# Patient Record
Sex: Male | Born: 1995
Health system: Western US, Academic
[De-identification: ages and names within clinical notes are randomized; demographics above are authoritative.]

---

## 2017-07-14 ENCOUNTER — Encounter: Payer: Self-pay | Admitting: Emergency Medicine

## 2017-07-14 ENCOUNTER — Emergency Department: Payer: 59

## 2017-07-14 ENCOUNTER — Emergency Department
Admission: EM | Admit: 2017-07-14 | Discharge: 2017-07-14 | Disposition: A | Discharge status: Home / Self Care | Payer: 59 | Attending: Emergency Medicine | Admitting: Emergency Medicine

## 2017-07-14 DIAGNOSIS — R51 Headache: Secondary | ICD-10-CM | POA: Diagnosis not present

## 2017-07-14 DIAGNOSIS — R569 Unspecified convulsions: Secondary | ICD-10-CM | POA: Insufficient documentation

## 2017-07-14 LAB — COMPREHENSIVE METABOLIC PANEL
ALT: 19 U/L (ref 17–63)
ANION GAP: 11 (ref 5–15)
AST: 28 U/L (ref 15–41)
Albumin: 4.8 g/dL (ref 3.5–5.0)
Alkaline Phosphatase: 59 U/L (ref 38–126)
BILIRUBIN TOTAL: 1.7 mg/dL — AB (ref 0.3–1.2)
BUN: 15 mg/dL (ref 6–20)
CO2: 20 mmol/L — ABNORMAL LOW (ref 22–32)
Calcium: 9.8 mg/dL (ref 8.9–10.3)
Chloride: 109 mmol/L (ref 101–111)
Creatinine, Ser: 1.3 mg/dL — ABNORMAL HIGH (ref 0.61–1.24)
Glucose, Bld: 172 mg/dL — ABNORMAL HIGH (ref 65–99)
POTASSIUM: 3.7 mmol/L (ref 3.5–5.1)
Sodium: 140 mmol/L (ref 135–145)
TOTAL PROTEIN: 7.4 g/dL (ref 6.5–8.1)

## 2017-07-14 LAB — CBC
HEMATOCRIT: 44.6 % (ref 40.0–52.0)
Hemoglobin: 15.7 g/dL (ref 13.0–18.0)
MCH: 32.1 pg (ref 26.0–34.0)
MCHC: 35.2 g/dL (ref 32.0–36.0)
MCV: 91.2 fL (ref 80.0–100.0)
Platelets: 265 10*3/uL (ref 150–440)
RBC: 4.89 MIL/uL (ref 4.40–5.90)
RDW: 12.9 % (ref 11.5–14.5)
WBC: 8.6 10*3/uL (ref 3.8–10.6)

## 2017-07-14 MED ORDER — ONDANSETRON HCL 4 MG/2ML IJ SOLN
4.0000 mg | Freq: Once | INTRAMUSCULAR | Status: AC
  Administered 2017-07-14: 4 mg via INTRAVENOUS
  Filled 2017-07-14: qty 2

## 2017-07-14 MED ORDER — SODIUM CHLORIDE 0.9 % IV BOLUS (SEPSIS)
1000.0000 mL | Freq: Once | INTRAVENOUS | Status: AC
  Administered 2017-07-14: 1000 mL via INTRAVENOUS
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
## 2017-07-14 NOTE — ED Notes (Signed)
Pt. Verbalizes understanding of d/c instructions and follow-up. VS stable and pain controlled per pt.  Pt. In NAD at time of d/c and denies further concerns regarding this visit. Pt. Stable at the time of departure from the unit, departing unit by the safest and most appropriate manner per that pt condition and limitations. Pt advised to return to the ED at any time for emergent concerns, or for new/worsening symptoms.   
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
## 2017-07-14 NOTE — Discharge Instructions (Signed)
Seizures may happen at any time. It is important to take certain precautions to maintain your safety.  ° °Follow up with your doctor in 1-3 days. ° °If you were started on a seizure medication, take it as prescribed. ° °During a seizure, a person may injure himself or herself. Seizure precautions are guidelines that a person can follow in order to minimize injury during a seizure. For any activity, it is important to ask, "What would happen if I had a seizure while doing this?" Follow the below precautions. ° °Bathroom Safety  °A person with seizures may want to shower instead of bathe to avoid accidental drowning. If falls occur during the patient's typical seizure, a person should use a shower seat, preferably one with a safety strap.  °Use nonskid strips in your shower or tub.  °Never use electrical equipment near water. This prevents accidental electrocution.  °Consider changing glass in shower doors to shatterproof glass. ° °Kitchen Safety °If possible, cook when someone else is nearby.  °Use the back burners of the stove to prevent accidental burns.  °Use shatterproof containers as much as possible. For instance, sauces can be transferred from glass bottles to plastic containers for use.  °Limit time that is required using knives or other sharp objects. If possible, buy foods that are already cut, or ask someone to help in meal preparation.  ° °General Safety at Home °Do not smoke or light fires in the fireplace unless someone else is present.  °Do not use space heaters that can be accidentally overturned.  °When alone, avoid using step stools or ladders, and do not clean rooftop gutters.  °Purchase power tools and motorized lawn equipment which have a safety switch that will stop the machine if you release the handle (a 'dead man's' switch).  ° °Driving and Transportation °Avoid driving unless your seizures are well controlled and/or you have permission to drive from your state's Department of Motor Vehicles    °(DMV). Each state has different laws. Please refer to the following link on the [employer]ile[employer]'s website for more information: http://www.epilepsyfoundation.org/answerplace/Social/driving/drivingu.cfm  °If you ride a bicycle, wear a helmet and any other necessary protective gear.  °When taking public transportation like the bus or subway, stay clear of the platform edge.  ° °Outdoor and Sports Safety °Swimming is okay, but does present certain risks. Never swim alone, and tell friends what to do if you have a seizure while swimming.  °Wear appropriate protective equipment.  °Ski with a friend. If a seizure occurs, your friend can seek help, if needed. He or she can also help to get you out of the cold. Consider using a safety hook or belt while riding the ski lift.  ° ° °

## 2017-08-25 ENCOUNTER — Other Ambulatory Visit: Payer: Self-pay | Admitting: Family Medicine

## 2017-08-25 ENCOUNTER — Ambulatory Visit
Admission: RE | Admit: 2017-08-25 | Discharge: 2017-08-25 | Disposition: A | Discharge status: Home / Self Care | Payer: 59 | Source: Ambulatory Visit | Attending: Family Medicine | Admitting: Family Medicine

## 2017-08-25 DIAGNOSIS — M7989 Other specified soft tissue disorders: Secondary | ICD-10-CM | POA: Diagnosis present

## 2017-08-25 DIAGNOSIS — M25572 Pain in left ankle and joints of left foot: Secondary | ICD-10-CM | POA: Diagnosis present

## 2017-08-25 DIAGNOSIS — R609 Edema, unspecified: Secondary | ICD-10-CM

## 2017-08-25 DIAGNOSIS — R52 Pain, unspecified: Secondary | ICD-10-CM

## 2017-08-25 DIAGNOSIS — X58XXXA Exposure to other specified factors, initial encounter: Secondary | ICD-10-CM | POA: Insufficient documentation

## 2017-08-25 DIAGNOSIS — S99912A Unspecified injury of left ankle, initial encounter: Secondary | ICD-10-CM | POA: Diagnosis not present

## 2017-12-13 ENCOUNTER — Other Ambulatory Visit: Payer: Self-pay

## 2017-12-13 ENCOUNTER — Emergency Department
Admission: EM | Admit: 2017-12-13 | Discharge: 2017-12-13 | Disposition: A | Discharge status: Home / Self Care | Payer: 59 | Attending: Emergency Medicine | Admitting: Emergency Medicine

## 2017-12-13 ENCOUNTER — Encounter: Payer: Self-pay | Admitting: Emergency Medicine

## 2017-12-13 DIAGNOSIS — L03114 Cellulitis of left upper limb: Secondary | ICD-10-CM

## 2017-12-13 DIAGNOSIS — Y998 Other external cause status: Secondary | ICD-10-CM | POA: Insufficient documentation

## 2017-12-13 DIAGNOSIS — X19XXXA Contact with other heat and hot substances, initial encounter: Secondary | ICD-10-CM | POA: Insufficient documentation

## 2017-12-13 DIAGNOSIS — Y939 Activity, unspecified: Secondary | ICD-10-CM | POA: Insufficient documentation

## 2017-12-13 DIAGNOSIS — Y929 Unspecified place or not applicable: Secondary | ICD-10-CM | POA: Insufficient documentation

## 2017-12-13 DIAGNOSIS — T22212A Burn of second degree of left forearm, initial encounter: Secondary | ICD-10-CM | POA: Diagnosis not present

## 2017-12-13 MED ORDER — SULFAMETHOXAZOLE-TRIMETHOPRIM 800-160 MG PO TABS: 1.0000 | ORAL_TABLET | Freq: Two times a day (BID) | ORAL | 0 refills | Status: AC

## 2017-12-13 MED ORDER — NAPROXEN 500 MG PO TABS: 500.0000 mg | ORAL_TABLET | Freq: Two times a day (BID) | ORAL | 0 refills | Status: AC
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
## 2018-04-14 ENCOUNTER — Ambulatory Visit

## 2018-04-15 ENCOUNTER — Ambulatory Visit

## 2018-04-16 ENCOUNTER — Ambulatory Visit: Payer: PRIVATE HEALTH INSURANCE

## 2018-04-20 ENCOUNTER — Telehealth

## 2018-04-20 NOTE — Telephone Encounter
Jeff Khan, Jeff Khan [phone] has been schedule for:    PMD: Dr. Marijean HeathBolano, Marielle on 04/23/2018 2 3:30   [address] [place] [id number]  [phone]    Neurology: Dr. Gayland Curryoojin, Kim on 04/30/2018 @11 :00am  [address] [place], [place] [id number]  [phone]  Spoke with Casimiro NeedleMichael in regards to scheduling the appt       Psychiatry: Dr. Tonny BollmanMensah, Michael on 06/23/2018 @ 2:00pm  [address] [place] [id number]  [phone]  Spoke with Judeth CornfieldStephanie in regards to the appt.      Called the pt to the number on filw [phone].was not able to get in contact with the pt. lvm indicating the reason for the call. Will follow up later today     Confirmation letter will be mailed out to the address on file

## 2018-04-23 ENCOUNTER — Ambulatory Visit

## 2018-04-30 ENCOUNTER — Ambulatory Visit

## 2018-05-03 ENCOUNTER — Ambulatory Visit

## 2018-05-03 DIAGNOSIS — R569 Unspecified convulsions: Secondary | ICD-10-CM

## 2018-05-03 MED ORDER — LEVETIRACETAM 500 MG PO TABS
500 mg | ORAL_TABLET | Freq: Two times a day (BID) | ORAL | 0 refills | Status: AC
Start: 2018-05-03 — End: ?
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
## 2018-11-02 IMAGING — CT CT HEAD W/O CM
3 series · 16 of 47 positions shown, 19 images · non-contrast
Comparison: None.

CLINICAL DATA: Seizure.

EXAM:
CT HEAD WITHOUT CONTRAST
TECHNIQUE: Contiguous axial images were obtained from the base of the skull
through the vertex without intravenous contrast.

[Series 2: head wo · axial · 0.45mm/px · z∈[-206,-76]mm · 10 of 32 slices shown, 13 images]
[im 3/32  brain]
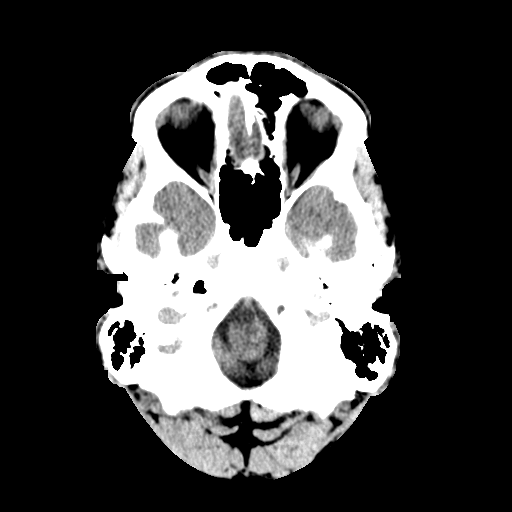
[im 3/32  bone]
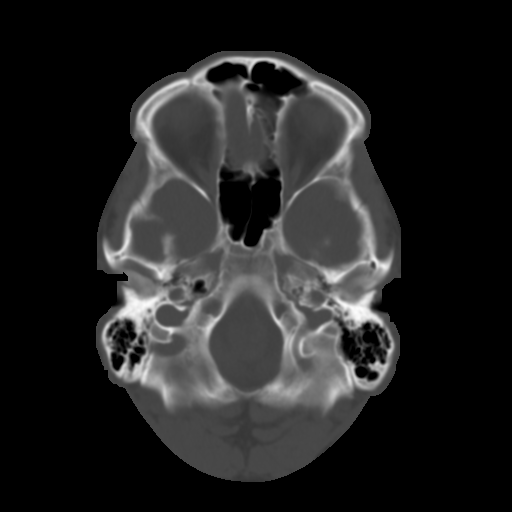
[im 6/32  brain]
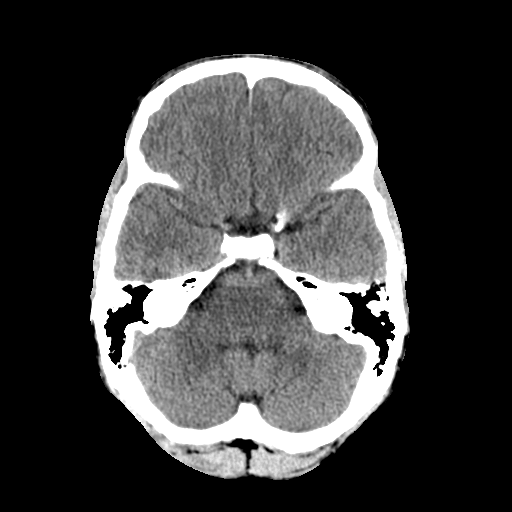
[im 9/32  brain]
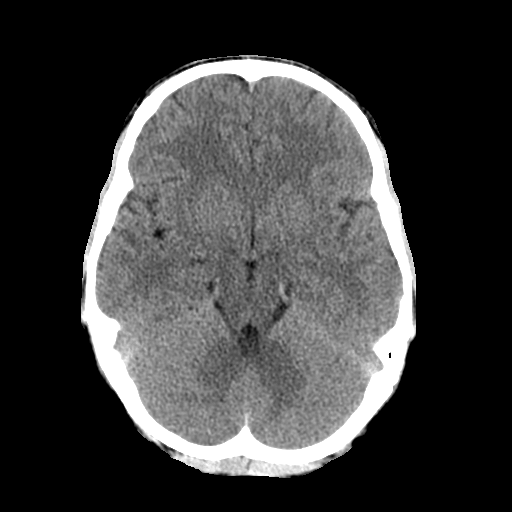
[im 11/32  brain]
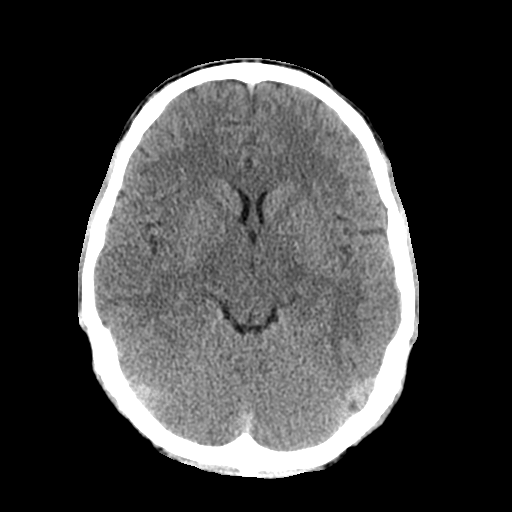
[im 14/32  brain]
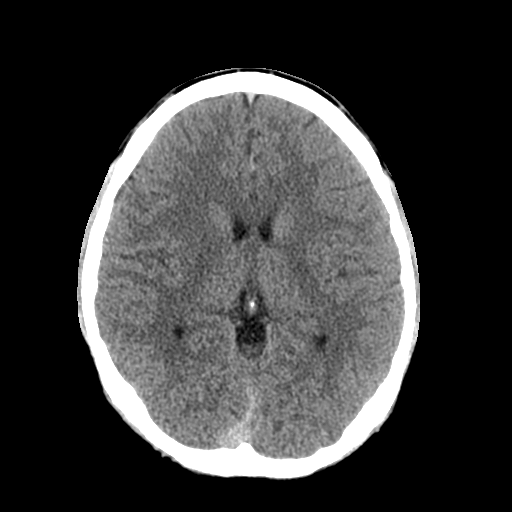
[im 14/32  bone]
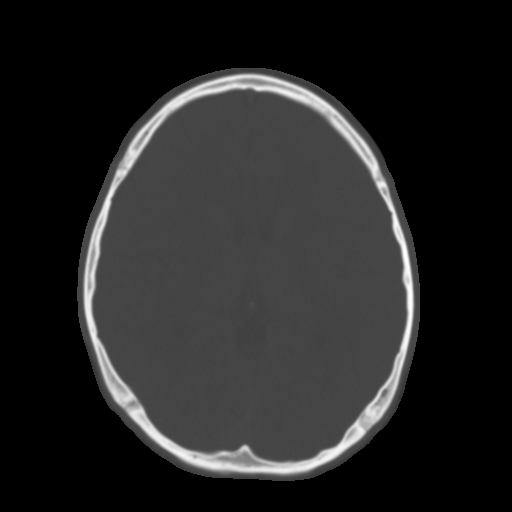
[im 18/32  brain]
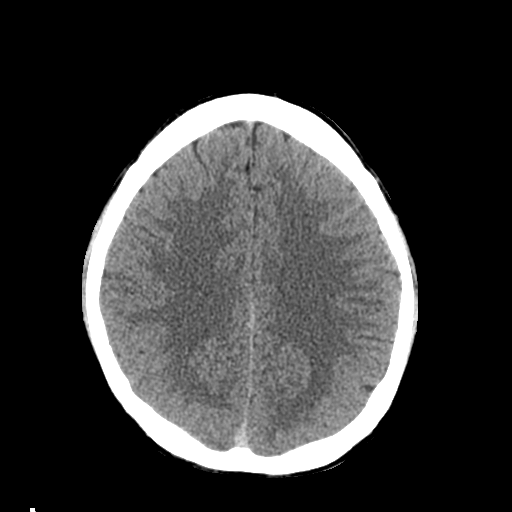
[im 21/32  brain]
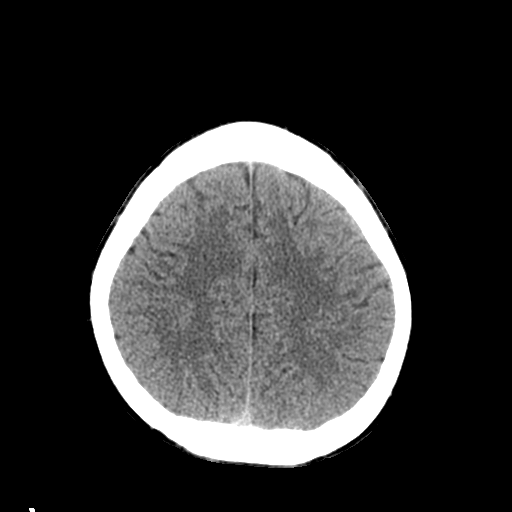
[im 24/32  brain]
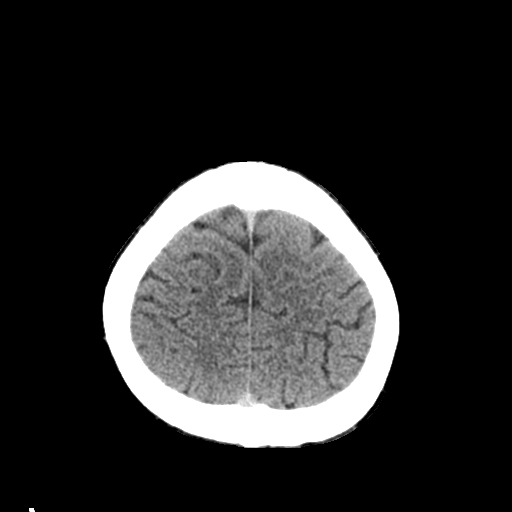
[im 26/32  brain]
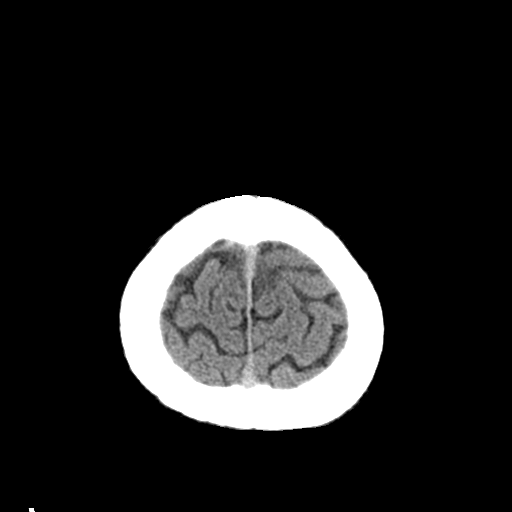
[im 26/32  bone]
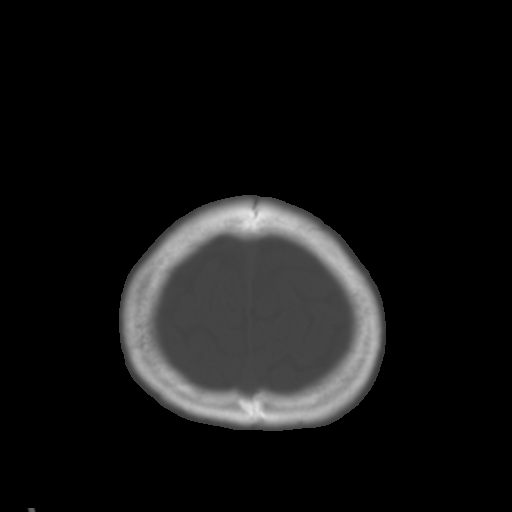
[im 29/32  brain]
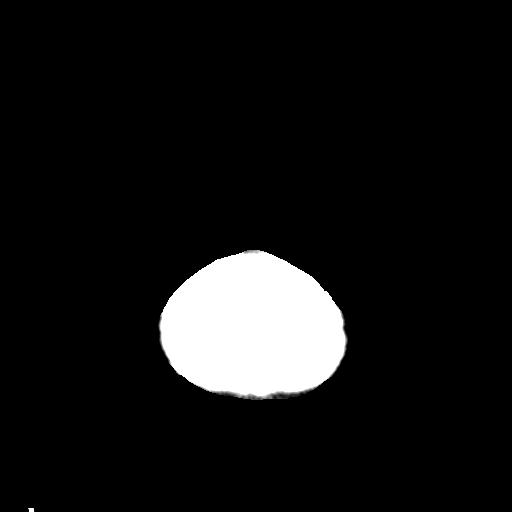

[Series 4: coronal soft tissue · coronal · 0.32mm/px · 3 of 62 slices shown]
[im 21/62  brain]
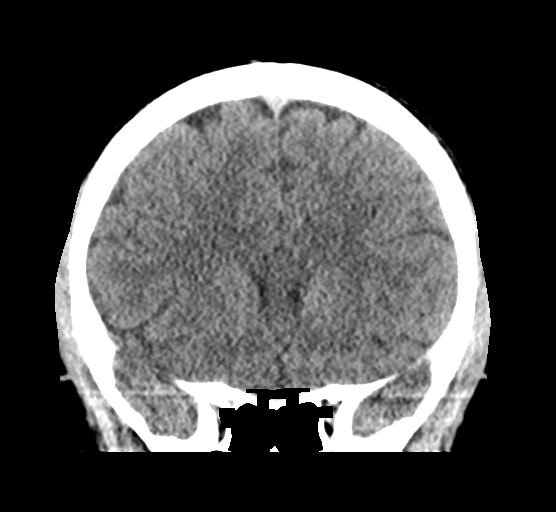
[im 28/62  brain]
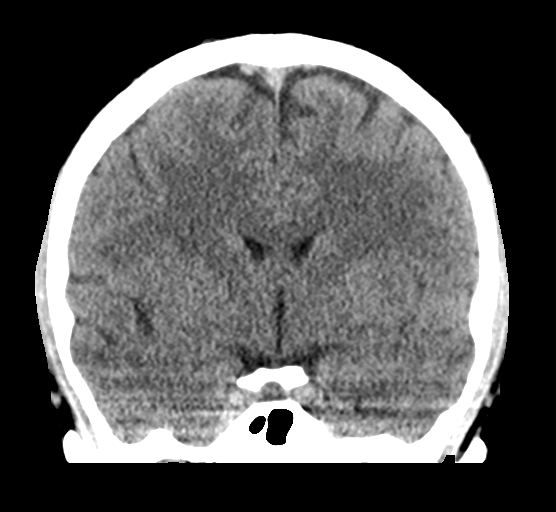
[im 34/62  brain]
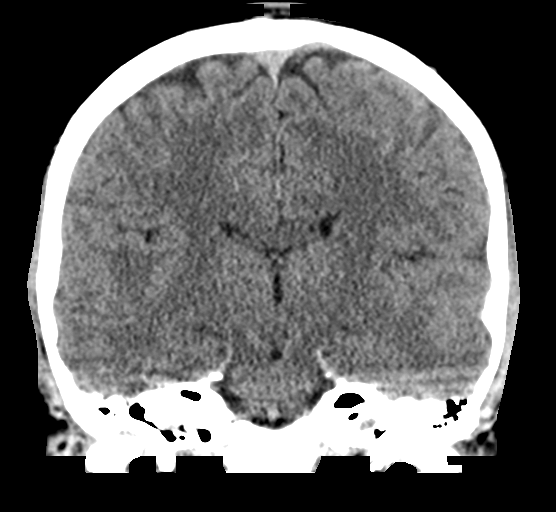

[Series 5: sagittal soft tissue · sagittal · 0.33mm/px · 3 of 54 slices shown]
[im 18/54  brain]
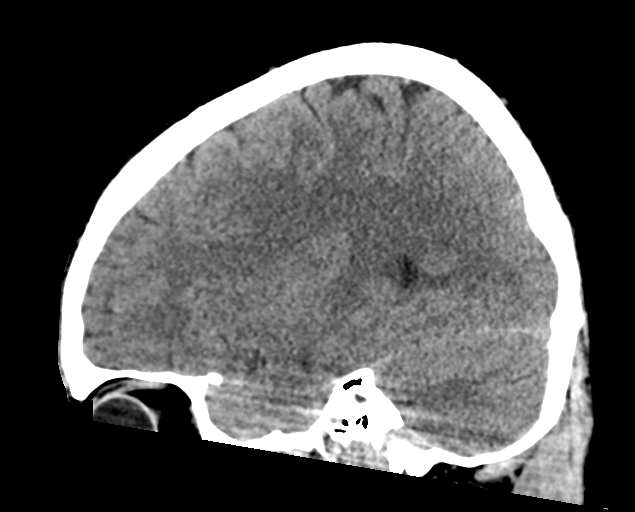
[im 27/54  brain]
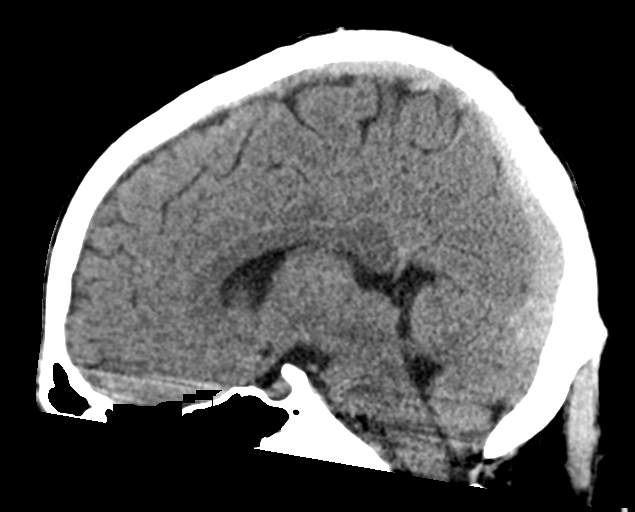
[im 36/54  brain]
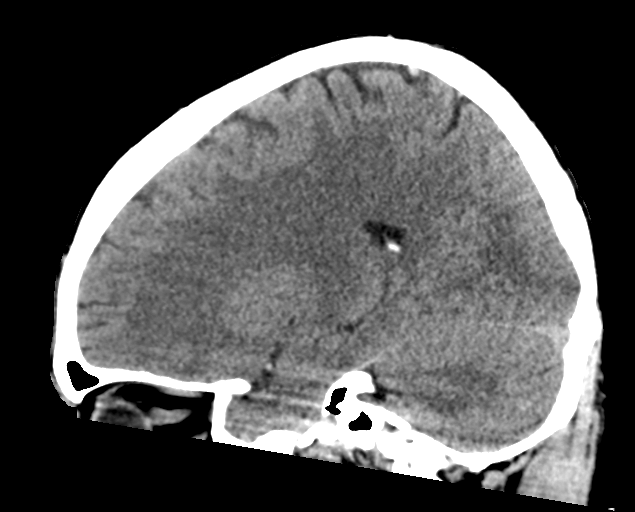

[16 of 47 positions shown; findings below may reference images not displayed]

FINDINGS: Brain: No evidence of acute infarction, hemorrhage, hydrocephalus,
extra-axial collection or mass lesion/mass effect.

Vascular: No hyperdense vessel or unexpected calcification.

Skull: Normal. Negative for fracture or focal lesion.

Sinuses/Orbits: No acute finding.

Other: None.
IMPRESSION: Normal brain.  No cause for seizure identified.

## 2018-12-14 IMAGING — CR DG FOOT COMPLETE 3+V*L*
1 series · 3 of 3 positions shown · non-contrast
Comparison: 08/25/2017

CLINICAL DATA: Left foot pain

EXAM:
LEFT FOOT - COMPLETE 3+ VIEW

[Series 1: dg foot complete left · 0.14mm/px · 3 of 3 slices shown]
[im 1/3]
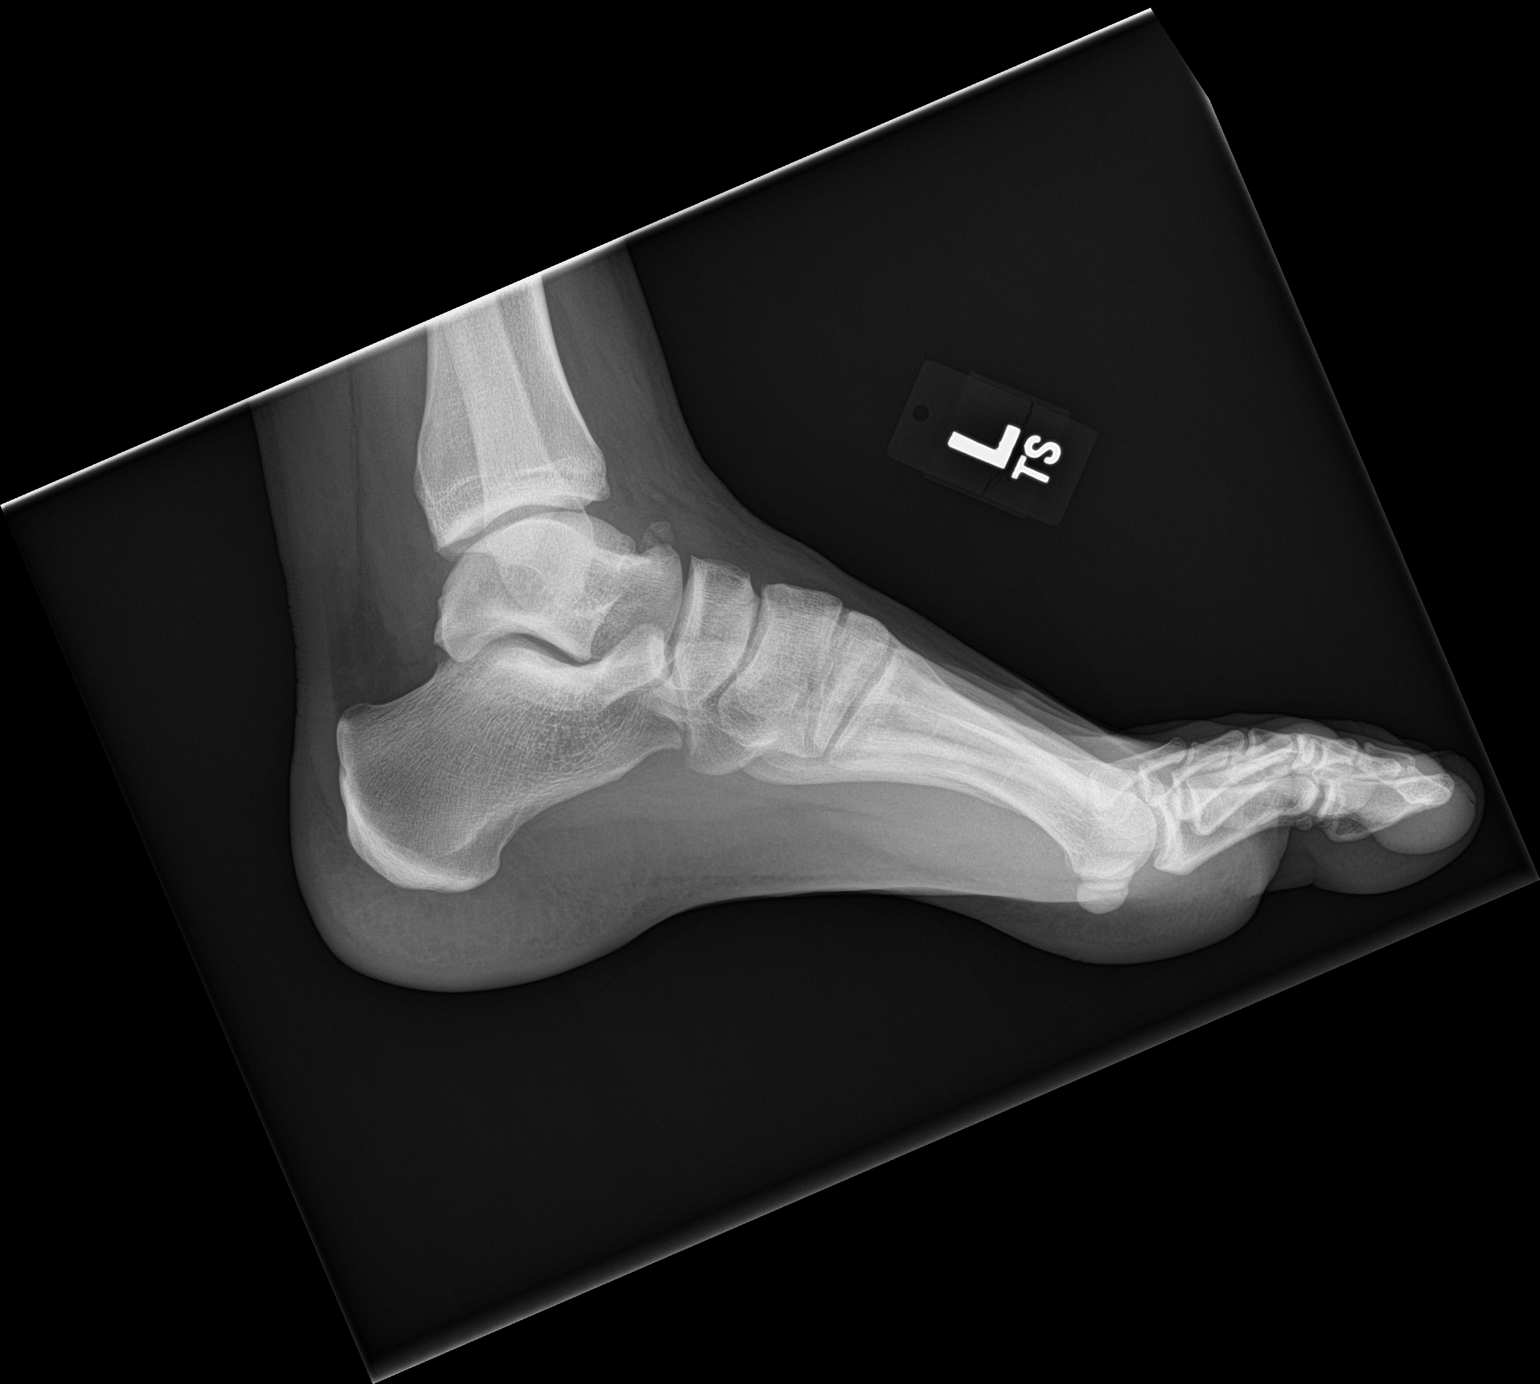
[im 2/3]
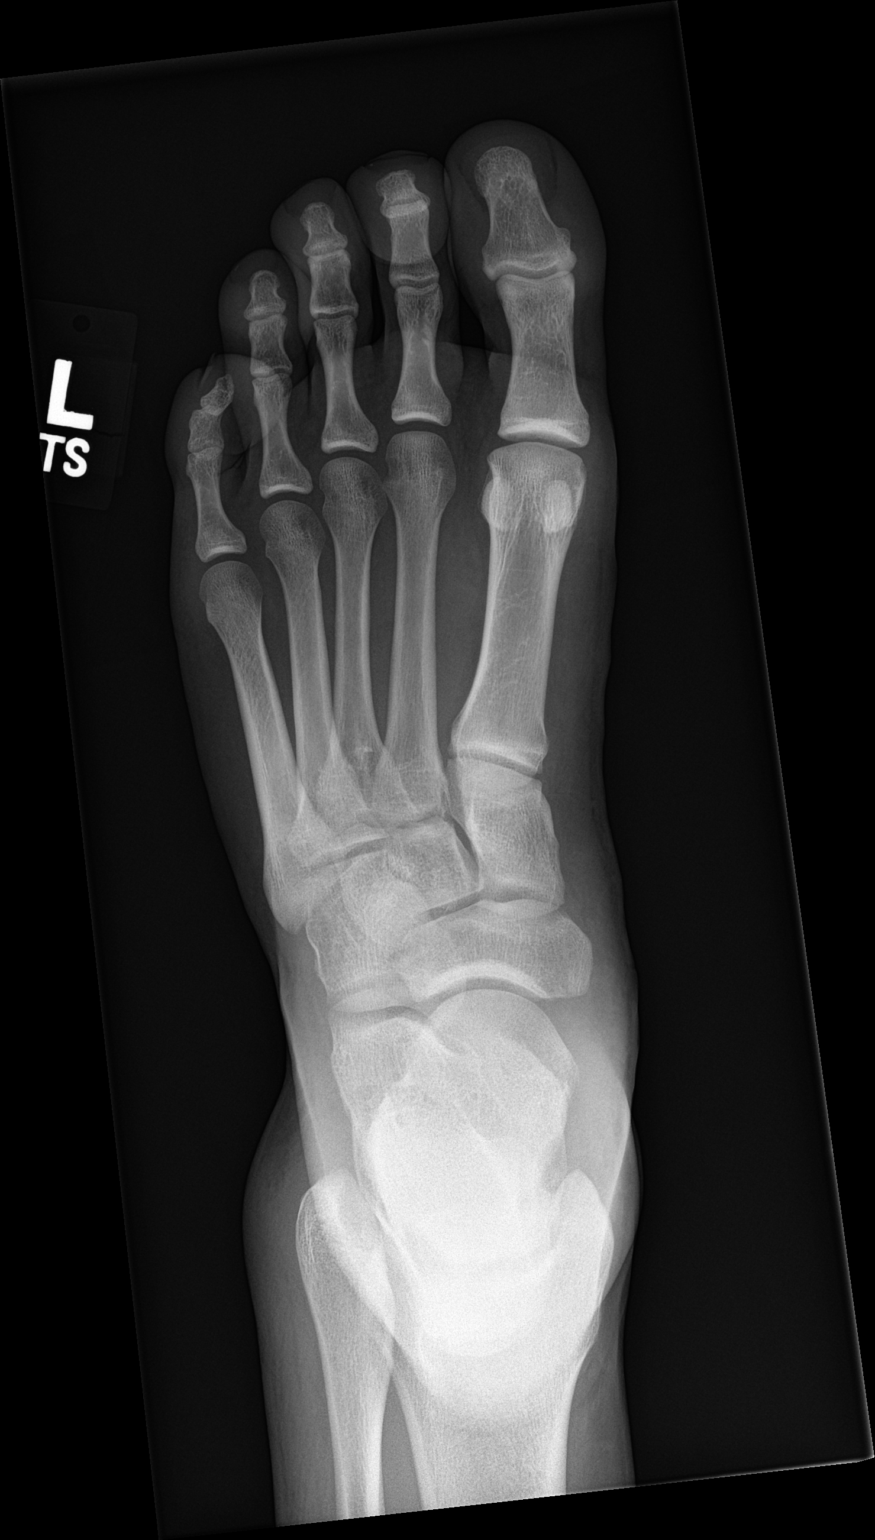
[im 3/3]
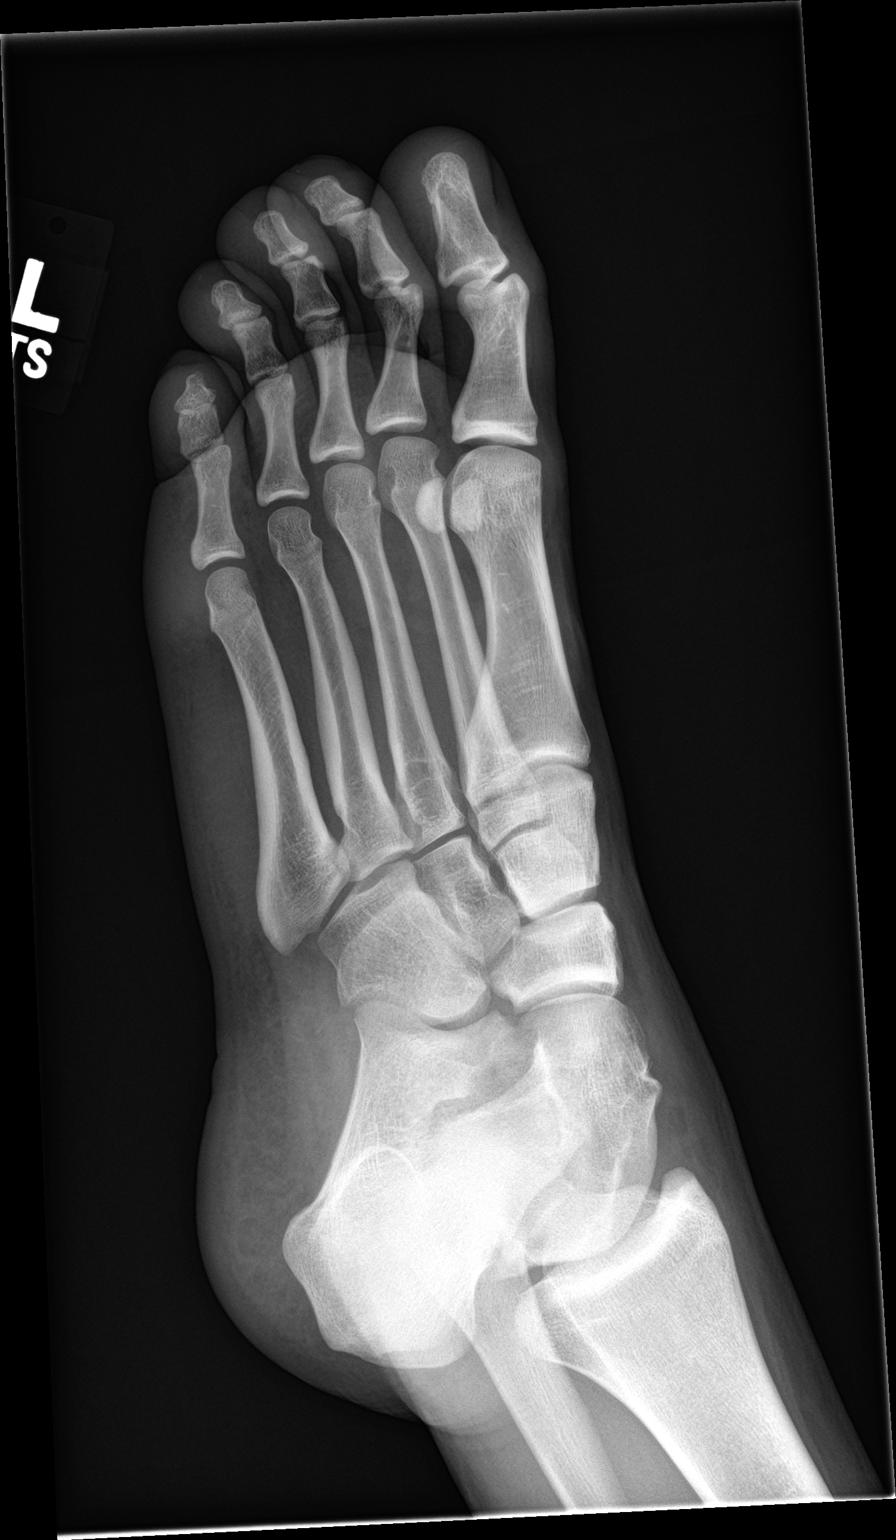

[3 of 3 positions shown; findings below may reference images not displayed]

FINDINGS: There is no evidence of fracture or dislocation. There is no
evidence of arthropathy or other focal bone abnormality. Soft
tissues are unremarkable.
IMPRESSION: Negative.
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
